# Patient Record
Sex: Female | Born: 1965 | Race: White | Hispanic: No | Marital: Married | Smoking: Light tobacco smoker
Health system: Southern US, Community
[De-identification: ages and names within clinical notes are randomized; demographics above are authoritative.]

## PROBLEM LIST (undated history)

## (undated) DIAGNOSIS — F419 Anxiety disorder, unspecified: Secondary | ICD-10-CM

## (undated) HISTORY — DX: Anxiety disorder, unspecified: F41.9

## (undated) HISTORY — PX: WISDOM TOOTH EXTRACTION: SHX21

## (undated) HISTORY — PX: COLONOSCOPY: SHX174

---

## 1998-11-11 ENCOUNTER — Other Ambulatory Visit: Admission: RE | Admit: 1998-11-11 | Discharge: 1998-11-11 | Payer: Self-pay | Admitting: Obstetrics and Gynecology

## 1999-12-17 ENCOUNTER — Other Ambulatory Visit: Admission: RE | Admit: 1999-12-17 | Discharge: 1999-12-17 | Payer: Self-pay | Admitting: Obstetrics and Gynecology

## 2000-03-15 ENCOUNTER — Other Ambulatory Visit: Admission: RE | Admit: 2000-03-15 | Discharge: 2000-03-15 | Payer: Self-pay | Admitting: Obstetrics and Gynecology

## 2000-10-23 ENCOUNTER — Ambulatory Visit (HOSPITAL_COMMUNITY): Admission: RE | Admit: 2000-10-23 | Discharge: 2000-10-23 | Payer: Self-pay | Admitting: Obstetrics and Gynecology

## 2000-10-23 ENCOUNTER — Encounter: Payer: Self-pay | Admitting: Obstetrics and Gynecology

## 2000-11-13 ENCOUNTER — Other Ambulatory Visit: Admission: RE | Admit: 2000-11-13 | Discharge: 2000-11-13 | Payer: Self-pay | Admitting: Obstetrics and Gynecology

## 2001-09-18 ENCOUNTER — Inpatient Hospital Stay (HOSPITAL_COMMUNITY): Admission: AD | Admit: 2001-09-18 | Discharge: 2001-09-18 | Payer: Self-pay | Admitting: Obstetrics and Gynecology

## 2001-09-19 ENCOUNTER — Inpatient Hospital Stay (HOSPITAL_COMMUNITY): Admission: AD | Admit: 2001-09-19 | Discharge: 2001-09-21 | Payer: Self-pay | Admitting: Obstetrics and Gynecology

## 2002-09-30 ENCOUNTER — Other Ambulatory Visit: Admission: RE | Admit: 2002-09-30 | Discharge: 2002-09-30 | Payer: Self-pay | Admitting: Obstetrics and Gynecology

## 2003-04-19 ENCOUNTER — Inpatient Hospital Stay (HOSPITAL_COMMUNITY): Admission: AD | Admit: 2003-04-19 | Discharge: 2003-04-21 | Payer: Self-pay | Admitting: Obstetrics and Gynecology

## 2003-04-24 ENCOUNTER — Encounter: Admission: RE | Admit: 2003-04-24 | Discharge: 2003-05-24 | Payer: Self-pay | Admitting: Obstetrics and Gynecology

## 2003-05-19 ENCOUNTER — Other Ambulatory Visit: Admission: RE | Admit: 2003-05-19 | Discharge: 2003-05-19 | Payer: Self-pay | Admitting: Obstetrics and Gynecology

## 2004-08-19 ENCOUNTER — Other Ambulatory Visit: Admission: RE | Admit: 2004-08-19 | Discharge: 2004-08-19 | Payer: Self-pay | Admitting: Obstetrics and Gynecology

## 2005-11-07 ENCOUNTER — Other Ambulatory Visit: Admission: RE | Admit: 2005-11-07 | Discharge: 2005-11-07 | Payer: Self-pay | Admitting: Obstetrics and Gynecology

## 2007-04-20 ENCOUNTER — Encounter: Admission: RE | Admit: 2007-04-20 | Discharge: 2007-04-20 | Payer: Self-pay | Admitting: Obstetrics and Gynecology

## 2007-12-26 ENCOUNTER — Ambulatory Visit: Payer: Self-pay | Admitting: Internal Medicine

## 2008-01-07 ENCOUNTER — Ambulatory Visit: Payer: Self-pay | Admitting: Internal Medicine

## 2014-05-23 ENCOUNTER — Other Ambulatory Visit: Payer: Self-pay | Admitting: Obstetrics and Gynecology

## 2014-05-23 DIAGNOSIS — R928 Other abnormal and inconclusive findings on diagnostic imaging of breast: Secondary | ICD-10-CM

## 2014-07-01 ENCOUNTER — Ambulatory Visit
Admission: RE | Admit: 2014-07-01 | Discharge: 2014-07-01 | Disposition: A | Discharge status: Home / Self Care | Payer: BC Managed Care – PPO | Source: Ambulatory Visit | Attending: Obstetrics and Gynecology | Admitting: Obstetrics and Gynecology

## 2014-07-01 DIAGNOSIS — R928 Other abnormal and inconclusive findings on diagnostic imaging of breast: Secondary | ICD-10-CM

## 2016-12-01 DIAGNOSIS — Z6822 Body mass index (BMI) 22.0-22.9, adult: Secondary | ICD-10-CM | POA: Diagnosis not present

## 2016-12-01 DIAGNOSIS — Z01419 Encounter for gynecological examination (general) (routine) without abnormal findings: Secondary | ICD-10-CM | POA: Diagnosis not present

## 2016-12-01 DIAGNOSIS — Z1231 Encounter for screening mammogram for malignant neoplasm of breast: Secondary | ICD-10-CM | POA: Diagnosis not present

## 2017-01-09 DIAGNOSIS — Z1321 Encounter for screening for nutritional disorder: Secondary | ICD-10-CM | POA: Diagnosis not present

## 2017-01-09 DIAGNOSIS — Z1329 Encounter for screening for other suspected endocrine disorder: Secondary | ICD-10-CM | POA: Diagnosis not present

## 2017-01-09 DIAGNOSIS — Z131 Encounter for screening for diabetes mellitus: Secondary | ICD-10-CM | POA: Diagnosis not present

## 2017-01-09 DIAGNOSIS — Z13220 Encounter for screening for lipoid disorders: Secondary | ICD-10-CM | POA: Diagnosis not present

## 2017-08-21 DIAGNOSIS — F411 Generalized anxiety disorder: Secondary | ICD-10-CM | POA: Diagnosis not present

## 2017-09-05 DIAGNOSIS — F411 Generalized anxiety disorder: Secondary | ICD-10-CM | POA: Diagnosis not present

## 2017-09-11 DIAGNOSIS — F411 Generalized anxiety disorder: Secondary | ICD-10-CM | POA: Diagnosis not present

## 2017-10-11 DIAGNOSIS — F411 Generalized anxiety disorder: Secondary | ICD-10-CM | POA: Diagnosis not present

## 2017-11-23 DIAGNOSIS — F411 Generalized anxiety disorder: Secondary | ICD-10-CM | POA: Diagnosis not present

## 2017-12-11 ENCOUNTER — Encounter: Payer: Self-pay | Admitting: Gastroenterology

## 2017-12-21 DIAGNOSIS — F411 Generalized anxiety disorder: Secondary | ICD-10-CM | POA: Diagnosis not present

## 2018-01-24 DIAGNOSIS — Z1231 Encounter for screening mammogram for malignant neoplasm of breast: Secondary | ICD-10-CM | POA: Diagnosis not present

## 2018-01-24 DIAGNOSIS — Z6822 Body mass index (BMI) 22.0-22.9, adult: Secondary | ICD-10-CM | POA: Diagnosis not present

## 2018-01-24 DIAGNOSIS — N926 Irregular menstruation, unspecified: Secondary | ICD-10-CM | POA: Diagnosis not present

## 2018-01-24 DIAGNOSIS — Z01419 Encounter for gynecological examination (general) (routine) without abnormal findings: Secondary | ICD-10-CM | POA: Diagnosis not present

## 2018-02-27 DIAGNOSIS — M5431 Sciatica, right side: Secondary | ICD-10-CM | POA: Diagnosis not present

## 2018-02-27 DIAGNOSIS — E7800 Pure hypercholesterolemia, unspecified: Secondary | ICD-10-CM | POA: Diagnosis not present

## 2018-02-27 DIAGNOSIS — R200 Anesthesia of skin: Secondary | ICD-10-CM | POA: Diagnosis not present

## 2018-02-27 DIAGNOSIS — F418 Other specified anxiety disorders: Secondary | ICD-10-CM | POA: Diagnosis not present

## 2018-03-02 DIAGNOSIS — R87810 Cervical high risk human papillomavirus (HPV) DNA test positive: Secondary | ICD-10-CM | POA: Diagnosis not present

## 2018-03-02 DIAGNOSIS — R87610 Atypical squamous cells of undetermined significance on cytologic smear of cervix (ASC-US): Secondary | ICD-10-CM | POA: Diagnosis not present

## 2018-03-02 DIAGNOSIS — Z3202 Encounter for pregnancy test, result negative: Secondary | ICD-10-CM | POA: Diagnosis not present

## 2018-03-02 DIAGNOSIS — N870 Mild cervical dysplasia: Secondary | ICD-10-CM | POA: Diagnosis not present

## 2018-03-29 ENCOUNTER — Encounter: Payer: Self-pay | Admitting: Gastroenterology

## 2018-05-14 ENCOUNTER — Encounter: Payer: Self-pay | Admitting: Gastroenterology

## 2018-05-14 ENCOUNTER — Ambulatory Visit (AMBULATORY_SURGERY_CENTER): Payer: BLUE CROSS/BLUE SHIELD

## 2018-05-14 VITALS — Ht 65.0 in | Wt 132.0 lb

## 2018-05-14 DIAGNOSIS — Z8371 Family history of colonic polyps: Secondary | ICD-10-CM

## 2018-05-14 MED ORDER — NA SULFATE-K SULFATE-MG SULF 17.5-3.13-1.6 GM/177ML PO SOLN: 1.0000 | Freq: Once | ORAL | 0 refills | Status: AC

## 2018-05-14 NOTE — Progress Notes (Signed)
Per pt, no allergies to soy or egg products.Pt not taking any weight loss meds or using  O2 at home.  Pt refused emmi video. 

## 2018-05-22 ENCOUNTER — Encounter: Payer: Self-pay | Admitting: Gastroenterology

## 2018-05-22 ENCOUNTER — Ambulatory Visit (AMBULATORY_SURGERY_CENTER): Payer: BLUE CROSS/BLUE SHIELD | Admitting: Gastroenterology

## 2018-05-22 VITALS — BP 134/87 | HR 64 | Temp 98.0°F | Resp 20 | Ht 65.0 in | Wt 132.0 lb

## 2018-05-22 DIAGNOSIS — Z1211 Encounter for screening for malignant neoplasm of colon: Secondary | ICD-10-CM

## 2018-05-22 DIAGNOSIS — Z8371 Family history of colonic polyps: Secondary | ICD-10-CM | POA: Diagnosis not present

## 2018-05-22 MED ORDER — SODIUM CHLORIDE 0.9 % IV SOLN: 500.0000 mL | Freq: Once | INTRAVENOUS | Status: DC

## 2018-05-22 NOTE — Patient Instructions (Signed)
YOU HAD AN ENDOSCOPIC PROCEDURE TODAY AT THE [facility]:   Refer to the procedure report that was given to you for any specific questions about what was found during the examination.  If the procedure report does not answer your questions, please call your gastroenterologist to clarify.  If you requested that your care partner not be given the details of your procedure findings, then the procedure report has been included in a sealed envelope for you to review at your convenience later.  YOU SHOULD EXPECT: Some feelings of bloating in the abdomen. Passage of more gas than usual.  Walking can help get rid of the air that was put into your GI tract during the procedure and reduce the bloating. If you had a lower endoscopy (such as a colonoscopy or flexible sigmoidoscopy) you may notice spotting of blood in your stool or on the toilet paper. If you underwent a bowel prep for your procedure, you may not have a normal bowel movement for a few days.  Please Note:  You might notice some irritation and congestion in your nose or some drainage.  This is from the oxygen used during your procedure.  There is no need for concern and it should clear up in a day or so.  SYMPTOMS TO REPORT IMMEDIATELY:   Following lower endoscopy (colonoscopy or flexible sigmoidoscopy):  Excessive amounts of blood in the stool  Significant tenderness or worsening of abdominal pains  Swelling of the abdomen that is new, acute  Fever of 100F or higher   For urgent or emergent issues, a gastroenterologist can be reached at any hour by calling [phone].   DIET:  We do recommend a small meal at first, but then you may proceed to your regular diet.  Drink plenty of fluids but you should avoid alcoholic beverages for 24 hours.Try to increase the fiber in your diet, and drink plenty of fluids.  ACTIVITY:  You should plan to take it easy for the rest of today and you should NOT DRIVE or use heavy machinery until  tomorrow (because of the sedation medicines used during the test).    FOLLOW UP: Our staff will call the number listed on your records the next business day following your procedure to check on you and address any questions or concerns that you may have regarding the information given to you following your procedure. If we do not reach you, we will leave a message.  However, if you are feeling well and you are not experiencing any problems, there is no need to return our call.  We will assume that you have returned to your regular daily activities without incident.  If any biopsies were taken you will be contacted by phone or by letter within the next 1-3 weeks.  Please call us at [phone] if you have not heard about the biopsies in 3 weeks.    SIGNATURES/CONFIDENTIALITY: You and/or your care partner have signed paperwork which will be entered into your electronic medical record.  These signatures attest to the fact that that the information above on your After Visit Summary has been reviewed and is understood.  Full responsibility of the confidentiality of this discharge information lies with you and/or your care-partner.  Read all of the handouts given to you by your recovery room nurse.

## 2018-05-22 NOTE — Op Note (Signed)
[facility] Patient Name: Janet Lawrence Procedure Date: 05/22/2018 8:58 AM MRN: [id number] Endoscopist: Napoleon Form , MD Age: 52 Referring MD:  Date of Birth: Sep 28, 1965 Gender: Female Account #: [id number] Procedure:                Colonoscopy Indications:              Screening for colorectal malignant neoplasm Medicines:                Monitored Anesthesia Care Procedure:                Pre-Anesthesia Assessment:                           - Prior to the procedure, a History and Physical                            was performed, and patient medications and                            allergies were reviewed. The patient's tolerance of                            previous anesthesia was also reviewed. The risks                            and benefits of the procedure and the sedation                            options and risks were discussed with the patient.                            All questions were answered, and informed consent                            was obtained. Prior Anticoagulants: The patient has                            taken no previous anticoagulant or antiplatelet                            agents. ASA Grade Assessment: II - A patient with                            mild systemic disease. After reviewing the risks                            and benefits, the patient was deemed in                            satisfactory condition to undergo the procedure.                           After obtaining informed consent, the colonoscope  was passed under direct vision. Throughout the                            procedure, the patient's blood pressure, pulse, and                            oxygen saturations were monitored continuously. The                            Colonoscope was introduced through the anus and                            advanced to the the cecum, identified by                            appendiceal orifice  and ileocecal valve. The                            colonoscopy was performed without difficulty. The                            patient tolerated the procedure well. The quality                            of the bowel preparation was excellent. The                            ileocecal valve, appendiceal orifice, and rectum                            were photographed. Scope In: 9:07:05 AM Scope Out: 9:23:37 AM Scope Withdrawal Time: 0 hours 9 minutes 9 seconds  Total Procedure Duration: 0 hours 16 minutes 32 seconds  Findings:                 The perianal and digital rectal examinations were                            normal.                           Non-bleeding internal hemorrhoids were found during                            retroflexion. The hemorrhoids were medium-sized.                           The exam was otherwise without abnormality. Complications:            No immediate complications. Estimated Blood Loss:     Estimated blood loss: none. Impression:               - Non-bleeding internal hemorrhoids.                           - The examination was otherwise normal.                           -  No specimens collected. Recommendation:           - Patient has a contact number available for                            emergencies. The signs and symptoms of potential                            delayed complications were discussed with the                            patient. Return to normal activities tomorrow.                            Written discharge instructions were provided to the                            patient.                           - Resume previous diet.                           - Continue present medications.                           - Repeat colonoscopy in 10 years for screening                            purposes. Napoleon Form, MD 05/22/2018 9:28:25 AM This report has been signed electronically.

## 2018-05-22 NOTE — Progress Notes (Signed)
Pt's states no medical or surgical changes since previsit or office visit. 

## 2018-05-22 NOTE — Progress Notes (Signed)
To PACU, vSS. Report to [email or web address]

## 2018-05-23 ENCOUNTER — Telehealth: Payer: Self-pay | Admitting: *Deleted

## 2018-05-23 NOTE — Telephone Encounter (Signed)
  Follow up Call-  Call back number 05/22/2018  Post procedure Call Back phone  # [phone]  Permission to leave phone message Yes  Some recent data might be hidden     Patient questions:  Do you have a fever, pain , or abdominal swelling? No. Pain Score  0 *  Have you tolerated food without any problems? Yes.    Have you been able to return to your normal activities? Yes.    Do you have any questions about your discharge instructions: Diet   No. Medications  No. Follow up visit  No.  Do you have questions or concerns about your Care? No.  Actions: * If pain score is 4 or above: No action needed, pain <4.

## 2020-03-30 ENCOUNTER — Other Ambulatory Visit: Payer: Self-pay | Admitting: Family Medicine

## 2020-03-30 DIAGNOSIS — M25552 Pain in left hip: Secondary | ICD-10-CM

## 2020-04-02 ENCOUNTER — Ambulatory Visit
Admission: RE | Admit: 2020-04-02 | Discharge: 2020-04-02 | Disposition: A | Discharge status: Home / Self Care | Payer: 59 | Source: Ambulatory Visit | Attending: Family Medicine | Admitting: Family Medicine

## 2020-04-02 DIAGNOSIS — M25552 Pain in left hip: Secondary | ICD-10-CM

## 2020-07-07 DIAGNOSIS — F4323 Adjustment disorder with mixed anxiety and depressed mood: Secondary | ICD-10-CM | POA: Diagnosis not present

## 2020-07-08 DIAGNOSIS — F4323 Adjustment disorder with mixed anxiety and depressed mood: Secondary | ICD-10-CM | POA: Diagnosis not present

## 2020-07-16 DIAGNOSIS — F4323 Adjustment disorder with mixed anxiety and depressed mood: Secondary | ICD-10-CM | POA: Diagnosis not present

## 2020-07-28 DIAGNOSIS — F4323 Adjustment disorder with mixed anxiety and depressed mood: Secondary | ICD-10-CM | POA: Diagnosis not present

## 2020-08-10 DIAGNOSIS — F4323 Adjustment disorder with mixed anxiety and depressed mood: Secondary | ICD-10-CM | POA: Diagnosis not present

## 2020-09-10 DIAGNOSIS — F4323 Adjustment disorder with mixed anxiety and depressed mood: Secondary | ICD-10-CM | POA: Diagnosis not present

## 2020-10-05 DIAGNOSIS — F4323 Adjustment disorder with mixed anxiety and depressed mood: Secondary | ICD-10-CM | POA: Diagnosis not present

## 2020-10-14 DIAGNOSIS — F4323 Adjustment disorder with mixed anxiety and depressed mood: Secondary | ICD-10-CM | POA: Diagnosis not present

## 2020-10-16 DIAGNOSIS — F4323 Adjustment disorder with mixed anxiety and depressed mood: Secondary | ICD-10-CM | POA: Diagnosis not present

## 2020-10-21 DIAGNOSIS — F4323 Adjustment disorder with mixed anxiety and depressed mood: Secondary | ICD-10-CM | POA: Diagnosis not present

## 2020-10-28 DIAGNOSIS — F4323 Adjustment disorder with mixed anxiety and depressed mood: Secondary | ICD-10-CM | POA: Diagnosis not present

## 2020-11-04 DIAGNOSIS — F4323 Adjustment disorder with mixed anxiety and depressed mood: Secondary | ICD-10-CM | POA: Diagnosis not present

## 2020-11-12 DIAGNOSIS — F4323 Adjustment disorder with mixed anxiety and depressed mood: Secondary | ICD-10-CM | POA: Diagnosis not present

## 2020-11-30 DIAGNOSIS — F4323 Adjustment disorder with mixed anxiety and depressed mood: Secondary | ICD-10-CM | POA: Diagnosis not present

## 2020-12-16 DIAGNOSIS — F4323 Adjustment disorder with mixed anxiety and depressed mood: Secondary | ICD-10-CM | POA: Diagnosis not present

## 2020-12-22 DIAGNOSIS — F4323 Adjustment disorder with mixed anxiety and depressed mood: Secondary | ICD-10-CM | POA: Diagnosis not present

## 2021-02-05 DIAGNOSIS — B353 Tinea pedis: Secondary | ICD-10-CM | POA: Diagnosis not present

## 2021-02-05 DIAGNOSIS — D225 Melanocytic nevi of trunk: Secondary | ICD-10-CM | POA: Diagnosis not present

## 2021-02-05 DIAGNOSIS — L578 Other skin changes due to chronic exposure to nonionizing radiation: Secondary | ICD-10-CM | POA: Diagnosis not present

## 2021-03-01 ENCOUNTER — Other Ambulatory Visit: Payer: Self-pay | Admitting: Radiology

## 2021-03-01 DIAGNOSIS — N63 Unspecified lump in unspecified breast: Secondary | ICD-10-CM

## 2021-03-01 DIAGNOSIS — N6310 Unspecified lump in the right breast, unspecified quadrant: Secondary | ICD-10-CM | POA: Diagnosis not present

## 2021-03-08 ENCOUNTER — Other Ambulatory Visit: Payer: Self-pay

## 2021-03-08 ENCOUNTER — Ambulatory Visit
Admission: RE | Admit: 2021-03-08 | Discharge: 2021-03-08 | Disposition: A | Discharge status: Home / Self Care | Payer: BC Managed Care – PPO | Source: Ambulatory Visit | Attending: Radiology | Admitting: Radiology

## 2021-03-08 ENCOUNTER — Ambulatory Visit
Admission: RE | Admit: 2021-03-08 | Discharge: 2021-03-08 | Disposition: A | Discharge status: Home / Self Care | Payer: BLUE CROSS/BLUE SHIELD | Source: Ambulatory Visit | Attending: Radiology | Admitting: Radiology

## 2021-03-08 DIAGNOSIS — R922 Inconclusive mammogram: Secondary | ICD-10-CM | POA: Diagnosis not present

## 2021-03-08 DIAGNOSIS — N63 Unspecified lump in unspecified breast: Secondary | ICD-10-CM

## 2021-03-08 DIAGNOSIS — N6311 Unspecified lump in the right breast, upper outer quadrant: Secondary | ICD-10-CM | POA: Diagnosis not present

## 2021-03-09 DIAGNOSIS — Z1329 Encounter for screening for other suspected endocrine disorder: Secondary | ICD-10-CM | POA: Diagnosis not present

## 2021-03-09 DIAGNOSIS — B373 Candidiasis of vulva and vagina: Secondary | ICD-10-CM | POA: Diagnosis not present

## 2021-03-09 DIAGNOSIS — Z131 Encounter for screening for diabetes mellitus: Secondary | ICD-10-CM | POA: Diagnosis not present

## 2021-03-09 DIAGNOSIS — Z01419 Encounter for gynecological examination (general) (routine) without abnormal findings: Secondary | ICD-10-CM | POA: Diagnosis not present

## 2021-03-09 DIAGNOSIS — R319 Hematuria, unspecified: Secondary | ICD-10-CM | POA: Diagnosis not present

## 2021-03-09 DIAGNOSIS — Z6821 Body mass index (BMI) 21.0-21.9, adult: Secondary | ICD-10-CM | POA: Diagnosis not present

## 2021-03-09 DIAGNOSIS — N760 Acute vaginitis: Secondary | ICD-10-CM | POA: Diagnosis not present

## 2021-03-09 DIAGNOSIS — N951 Menopausal and female climacteric states: Secondary | ICD-10-CM | POA: Diagnosis not present

## 2021-03-09 DIAGNOSIS — Z13220 Encounter for screening for lipoid disorders: Secondary | ICD-10-CM | POA: Diagnosis not present

## 2021-03-15 ENCOUNTER — Other Ambulatory Visit: Payer: BLUE CROSS/BLUE SHIELD

## 2021-04-19 DIAGNOSIS — F418 Other specified anxiety disorders: Secondary | ICD-10-CM | POA: Diagnosis not present

## 2021-04-19 DIAGNOSIS — Z0000 Encounter for general adult medical examination without abnormal findings: Secondary | ICD-10-CM | POA: Diagnosis not present

## 2021-04-19 DIAGNOSIS — R300 Dysuria: Secondary | ICD-10-CM | POA: Diagnosis not present

## 2021-04-19 DIAGNOSIS — E7800 Pure hypercholesterolemia, unspecified: Secondary | ICD-10-CM | POA: Diagnosis not present

## 2021-06-15 ENCOUNTER — Encounter (HOSPITAL_COMMUNITY): Payer: Self-pay | Admitting: Emergency Medicine

## 2021-06-15 ENCOUNTER — Emergency Department (HOSPITAL_COMMUNITY)
Admission: EM | Admit: 2021-06-15 | Discharge: 2021-06-16 | Disposition: A | Discharge status: Home / Self Care | Payer: BC Managed Care – PPO | Attending: Student | Admitting: Student

## 2021-06-15 ENCOUNTER — Other Ambulatory Visit: Payer: Self-pay

## 2021-06-15 DIAGNOSIS — F17200 Nicotine dependence, unspecified, uncomplicated: Secondary | ICD-10-CM | POA: Diagnosis not present

## 2021-06-15 DIAGNOSIS — J029 Acute pharyngitis, unspecified: Secondary | ICD-10-CM | POA: Insufficient documentation

## 2021-06-15 MED ORDER — BENZOCAINE 20 % MT AERO
INHALATION_SPRAY | Freq: Once | OROMUCOSAL | Status: DC
  Filled 2021-06-15: qty 57

## 2021-06-15 NOTE — ED Triage Notes (Signed)
Patient reports a piece of pasta lodged at lower throat while eating this evening , airway intact , respirations unlabored , curenttly taking oral antibiotic for sore throat .

## 2021-06-15 NOTE — ED Provider Notes (Signed)
Emergency Medicine Provider Triage Evaluation Note  Janet Lawrence , a 55 y.o. female  was evaluated in triage.  Pt complains of sore throat concerns of a mucosal catheter.  Patient states that she was eating Posta and felt a, throat pain started over the last 3 hours, she has been unable to dislodge it, she states she still able to tolerate p.o., no nausea or vomiting.  she also notes that she is having a sore throat since yesterday, states that she had a virtual visit with the MD who started her on amoxicillin for possible strep.  And then sent her here today concerned that she might have a foreign body stuck in her throat.  Review of Systems  Positive: Sore throat, foreign body in throat. Negative: Shortness of breath, difficulty swallowing  Physical Exam  BP (!) 141/98 (BP Location: Right Arm)   Pulse 73   Temp 99.2 F (37.3 C) (Oral)   Resp 16   Ht 5\' 4"  (1.626 m)   Wt 65 kg   SpO2 100%   BMI 24.60 kg/m  Gen:   Awake, no distress   Resp:  Normal effort normal respirations, no wheezing, stridor, no respiratory distress present. MSK:   Moves extremities without difficulty  Other:  /Visualized tongue uvula are both midline, controlling oral secretions, tonsils are equal and symmetrical bilaterally no erythema or exudates present, no foreign object noted.  Medical Decision Making  Medically screening exam initiated at 11:38 PM.  Appropriate orders placed.  Velena Keegan was informed that the remainder of the evaluation will be completed by another provider, this initial triage assessment does not replace that evaluation, and the importance of remaining in the ED until their evaluation is complete.  Presents with foreign body in throat/sore throat has been ordered, patient need further work-up.   Ardis Rowan, PA-C 06/15/21 2341    13/08/22, MD 06/16/21 [phone]

## 2021-06-16 LAB — GROUP A STREP BY PCR: Group A Strep by PCR: NOT DETECTED

## 2021-06-16 NOTE — ED Provider Notes (Signed)
[facility] EMERGENCY DEPARTMENT Provider Note   CSN: [id number] Arrival date & time: 06/15/21  2311     History Chief Complaint  Patient presents with   Pasta Lodged in Throat/Sore Throat    Janet Lawrence is a 55 y.o. female.  HPI  Patient with no significant medical history presents to the emergency department with chief complaint of a foreign body in her throat.  Patient states that while she was eating bowtie pasta today she felt as if something was stuck in her throat, she states that she has been unable to get it out.  She tried drinking water and coughing up but was unsuccessful.  She states she still able to tolerate p.o. without any difficulty, she denies  shortness of breath, difficulty breathing, she denies coughing.  She does note that she has been having a sore throat x1 day, her sore throat started yesterday, states the pain has gotten worse today, she had video visit and was started  on amoxicillin for possible strep throat and told her to come the ED for further evaluation of the foreign body in her throat.  She has no other complaints at this time.  She has no history of esophageal strictures, no history of foreign body stuck in her throat, she does not endorse fevers, chills, chest pain, shortness of breath, worsening peripheral edema.  Past Medical History:  Diagnosis Date   Anxiety     There are no problems to display for this patient.   Past Surgical History:  Procedure Laterality Date   COLONOSCOPY     WISDOM TOOTH EXTRACTION       OB History   No obstetric history on file.     Family History  Problem Relation Age of Onset   Thyroid disease Mother    Non-Hodgkin's lymphoma Father    Alzheimer's disease Father    Ankylosing spondylitis Father    Colon polyps Brother    Ankylosing spondylitis Brother     Social History   Tobacco Use   Smoking status: Light Smoker   Smokeless tobacco: Never   Tobacco comments:    socially   Substance Use Topics   Alcohol use: Yes    Alcohol/week: 5.0 - 7.0 standard drinks    Types: 5 - 7 Glasses of wine per week   Drug use: Not Currently    Home Medications Prior to Admission medications   Medication Sig Start Date End Date Taking? Authorizing Provider  Biotin 1 MG CAPS Take by mouth daily.    [provider]  Multiple Vitamin (MULTIVITAMIN) tablet Take 1 tablet by mouth daily.    [provider]  propranolol (INDERAL) 10 MG tablet Take 10 mg by mouth as needed.    [provider]  Riboflavin (B2 PO) Take by mouth daily.    [provider]    Allergies    Patient has no known allergies.  Review of Systems   Review of Systems  Constitutional:  Negative for chills and fever.  HENT:  Positive for sore throat. Negative for congestion.        Foreign body in throat.  Respiratory:  Negative for shortness of breath.   Cardiovascular:  Negative for chest pain.  Gastrointestinal:  Negative for abdominal pain.  Genitourinary:  Negative for enuresis.  Musculoskeletal:  Negative for back pain.  Skin:  Negative for rash.  Neurological:  Negative for dizziness.  Hematological:  Does not bruise/bleed easily.   Physical Exam Updated Vital  Signs BP (!) 141/98 (BP Location: Right Arm)   Pulse 73   Temp 99.2 F (37.3 C) (Oral)   Resp 16   Ht 5\' 4"  (1.626 m)   Wt 65 kg   SpO2 100%   BMI 24.60 kg/m   Physical Exam Vitals and nursing note reviewed.  Constitutional:      General: She is not in acute distress.    Appearance: She is not ill-appearing.  HENT:     Head: Normocephalic and atraumatic.     Nose: No congestion.     Mouth/Throat:     Mouth: Mucous membranes are moist.     Pharynx: Oropharynx is clear. No oropharyngeal exudate or posterior oropharyngeal erythema.     Comments: No trismus or torticollis present, oropharynx is visualized tongue and uvula are both midline, controlling oral secretions, tonsils were equal and  symmetrical bilaterally, no erythema or exudate present. Eyes:     Conjunctiva/sclera: Conjunctivae normal.  Cardiovascular:     Rate and Rhythm: Normal rate and regular rhythm.     Pulses: Normal pulses.     Heart sounds: No murmur heard.   No friction rub. No gallop.  Pulmonary:     Effort: No respiratory distress.     Breath sounds: No stridor. No wheezing, rhonchi or rales.  Skin:    General: Skin is warm and dry.  Neurological:     Mental Status: She is alert.  Psychiatric:        Mood and Affect: Mood normal.    ED Results / Procedures / Treatments   Labs (all labs ordered are listed, but only abnormal results are displayed) Labs Reviewed  GROUP A STREP BY PCR    EKG None  Radiology No results found.  Procedures Procedures   Medications Ordered in ED Medications  Benzocaine (HURRCAINE) 20 % mouth spray (has no administration in time range)    ED Course  I have reviewed the triage vital signs and the nursing notes.  Pertinent labs & imaging results that were available during my care of the patient were reviewed by me and considered in my medical decision making (see chart for details).    MDM Rules/Calculators/A&P                          Initial impression-presents with sore throat foreign body sensation.  She is alert, does not appear in distress, vital signs reassuring.  Will recommend flexible laryngeal scope for further evaluation.  Work-up-strep test is pending at this time.  Reassessment-while using a flexible laryngeal scope Dr. examined patient's oropharynx, there is no foreign bodies or other gross abnormalities present.  Patient tolerated the procedure well.  Rule out-I have low suspicion for foreign body in the esophagus/oropharynx as she tolerating p.o. without difficulty, laryngoscope was negative for any acute findings.  I have low suspicion for foreign body in the upper airway has lung sounds were clear bilaterally, no wheezing or  stridor present.  I have low suspicion for peritonsillar or retropharyngeal abscess as tonsils were equal and symmetrical bilaterally uvula was midline she is controlling oral secretions.  Plan-  Sore throat/foreign body-likely patient setting from pharyngitis, she is currently on amoxicillin  will have her continue with this as it would cover for strep throat.  If she continues to have foreign body sensation will have her follow-up with ENT for further evaluation.  Gave her strict return precautions.  Vital signs have remained stable, no  indication for hospital admission.  Patient discussed with attending and they agreed with assessment and plan.  Patient given at home care as well strict return precautions.  Patient verbalized that they understood agreed to said plan.  Final Clinical Impression(s) / ED Diagnoses Final diagnoses:  Sore throat    Rx / DC Orders ED Discharge Orders     None        Carroll Sage, PA-C 06/16/21 0139    Gloris Manchester, MD 06/16/21 [phone]

## 2021-06-16 NOTE — Discharge Instructions (Addendum)
Exam was all reassuring, your strep test is pending at this time.  I want to continue with the amoxicillin that was prescribed to you as this would cover you for strep throat.   I want you to follow-up with ENT if symptoms do not resolve or are  not improving after 2 to 3 days, give you the contact information above.  Come back to the emergency department if you develop chest pain, shortness of breath, severe abdominal pain, uncontrolled nausea, vomiting, diarrhea.

## 2021-06-25 DIAGNOSIS — L658 Other specified nonscarring hair loss: Secondary | ICD-10-CM | POA: Diagnosis not present

## 2021-06-25 DIAGNOSIS — D485 Neoplasm of uncertain behavior of skin: Secondary | ICD-10-CM | POA: Diagnosis not present

## 2021-06-25 DIAGNOSIS — L820 Inflamed seborrheic keratosis: Secondary | ICD-10-CM | POA: Diagnosis not present

## 2022-02-05 DIAGNOSIS — R051 Acute cough: Secondary | ICD-10-CM | POA: Diagnosis not present

## 2022-02-15 DIAGNOSIS — L578 Other skin changes due to chronic exposure to nonionizing radiation: Secondary | ICD-10-CM | POA: Diagnosis not present

## 2022-02-15 DIAGNOSIS — D225 Melanocytic nevi of trunk: Secondary | ICD-10-CM | POA: Diagnosis not present

## 2022-02-15 DIAGNOSIS — B353 Tinea pedis: Secondary | ICD-10-CM | POA: Diagnosis not present

## 2022-03-24 DIAGNOSIS — Z6822 Body mass index (BMI) 22.0-22.9, adult: Secondary | ICD-10-CM | POA: Diagnosis not present

## 2022-03-24 DIAGNOSIS — Z1231 Encounter for screening mammogram for malignant neoplasm of breast: Secondary | ICD-10-CM | POA: Diagnosis not present

## 2022-03-24 DIAGNOSIS — Z131 Encounter for screening for diabetes mellitus: Secondary | ICD-10-CM | POA: Diagnosis not present

## 2022-03-24 DIAGNOSIS — Z13228 Encounter for screening for other metabolic disorders: Secondary | ICD-10-CM | POA: Diagnosis not present

## 2022-03-24 DIAGNOSIS — Z1321 Encounter for screening for nutritional disorder: Secondary | ICD-10-CM | POA: Diagnosis not present

## 2022-03-24 DIAGNOSIS — Z1329 Encounter for screening for other suspected endocrine disorder: Secondary | ICD-10-CM | POA: Diagnosis not present

## 2022-03-24 DIAGNOSIS — Z1151 Encounter for screening for human papillomavirus (HPV): Secondary | ICD-10-CM | POA: Diagnosis not present

## 2022-03-24 DIAGNOSIS — Z01419 Encounter for gynecological examination (general) (routine) without abnormal findings: Secondary | ICD-10-CM | POA: Diagnosis not present

## 2022-03-24 DIAGNOSIS — Z13220 Encounter for screening for lipoid disorders: Secondary | ICD-10-CM | POA: Diagnosis not present

## 2022-03-24 DIAGNOSIS — Z124 Encounter for screening for malignant neoplasm of cervix: Secondary | ICD-10-CM | POA: Diagnosis not present

## 2022-05-06 DIAGNOSIS — M25572 Pain in left ankle and joints of left foot: Secondary | ICD-10-CM | POA: Diagnosis not present

## 2022-06-06 DIAGNOSIS — M25572 Pain in left ankle and joints of left foot: Secondary | ICD-10-CM | POA: Diagnosis not present

## 2022-06-10 DIAGNOSIS — M25572 Pain in left ankle and joints of left foot: Secondary | ICD-10-CM | POA: Diagnosis not present

## 2022-06-15 DIAGNOSIS — Z0000 Encounter for general adult medical examination without abnormal findings: Secondary | ICD-10-CM | POA: Diagnosis not present

## 2022-06-15 DIAGNOSIS — E7800 Pure hypercholesterolemia, unspecified: Secondary | ICD-10-CM | POA: Diagnosis not present

## 2022-06-15 DIAGNOSIS — R400 Somnolence: Secondary | ICD-10-CM | POA: Diagnosis not present

## 2022-06-15 DIAGNOSIS — F418 Other specified anxiety disorders: Secondary | ICD-10-CM | POA: Diagnosis not present

## 2022-06-15 DIAGNOSIS — R61 Generalized hyperhidrosis: Secondary | ICD-10-CM | POA: Diagnosis not present

## 2022-06-20 DIAGNOSIS — M25572 Pain in left ankle and joints of left foot: Secondary | ICD-10-CM | POA: Diagnosis not present

## 2022-07-06 DIAGNOSIS — G4719 Other hypersomnia: Secondary | ICD-10-CM | POA: Diagnosis not present

## 2022-12-02 DIAGNOSIS — Q6589 Other specified congenital deformities of hip: Secondary | ICD-10-CM | POA: Diagnosis not present

## 2022-12-13 DIAGNOSIS — M6281 Muscle weakness (generalized): Secondary | ICD-10-CM | POA: Diagnosis not present

## 2022-12-13 DIAGNOSIS — M1632 Unilateral osteoarthritis resulting from hip dysplasia, left hip: Secondary | ICD-10-CM | POA: Diagnosis not present

## 2022-12-22 DIAGNOSIS — M6281 Muscle weakness (generalized): Secondary | ICD-10-CM | POA: Diagnosis not present

## 2022-12-22 DIAGNOSIS — M1632 Unilateral osteoarthritis resulting from hip dysplasia, left hip: Secondary | ICD-10-CM | POA: Diagnosis not present

## 2022-12-29 DIAGNOSIS — M1632 Unilateral osteoarthritis resulting from hip dysplasia, left hip: Secondary | ICD-10-CM | POA: Diagnosis not present

## 2022-12-29 DIAGNOSIS — M6281 Muscle weakness (generalized): Secondary | ICD-10-CM | POA: Diagnosis not present

## 2023-01-04 DIAGNOSIS — M25552 Pain in left hip: Secondary | ICD-10-CM | POA: Diagnosis not present

## 2023-01-25 DIAGNOSIS — M25562 Pain in left knee: Secondary | ICD-10-CM | POA: Diagnosis not present

## 2023-02-01 DIAGNOSIS — S73192A Other sprain of left hip, initial encounter: Secondary | ICD-10-CM | POA: Diagnosis not present

## 2023-02-21 DIAGNOSIS — L658 Other specified nonscarring hair loss: Secondary | ICD-10-CM | POA: Diagnosis not present

## 2023-02-21 DIAGNOSIS — B351 Tinea unguium: Secondary | ICD-10-CM | POA: Diagnosis not present

## 2023-02-21 DIAGNOSIS — D225 Melanocytic nevi of trunk: Secondary | ICD-10-CM | POA: Diagnosis not present

## 2023-02-21 DIAGNOSIS — L578 Other skin changes due to chronic exposure to nonionizing radiation: Secondary | ICD-10-CM | POA: Diagnosis not present

## 2023-04-04 ENCOUNTER — Other Ambulatory Visit (INDEPENDENT_AMBULATORY_CARE_PROVIDER_SITE_OTHER): Payer: BC Managed Care – PPO

## 2023-04-04 ENCOUNTER — Ambulatory Visit (INDEPENDENT_AMBULATORY_CARE_PROVIDER_SITE_OTHER): Payer: BC Managed Care – PPO | Admitting: Orthopaedic Surgery

## 2023-04-04 DIAGNOSIS — M1612 Unilateral primary osteoarthritis, left hip: Secondary | ICD-10-CM | POA: Diagnosis not present

## 2023-04-04 DIAGNOSIS — M25552 Pain in left hip: Secondary | ICD-10-CM

## 2023-04-04 NOTE — Progress Notes (Signed)
The patient is an incredibly active and active 57 year old female who comes in for an opinion as a relates to her left hip.  She is an avid runner and continues to be passion about running.  She is a petite individual as well.  She has been dealing with left hip pain for some time now and did have a MRI arthrogram of her left hip in June.  They did place a steroid injection in her left hip at that time and that this did help some.  She has been dealing with groin pain.  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  There was mild arthritic findings to moderate arthritic findings of that left hip.  Her right hip has not had any issues at all.  She continues to workout but has held off her usual running.  She would still like to run.  She walks with a normal gait.  Her left hip shows some pain in the groin on the extremes of rotation but there is no block to rotation.  She is very limber.  She has good muscle quality as well.  We did obtain a standing AP pelvis and lateral of her left hip today.  We looked at those x-rays together and she does have superior lateral joint space narrowing on the left side compared to the right side and there is just slight more uncoverage of the femoral head.  We did look at the MRI and there is a labral tear as well and some subchondral edema in the hip.  We had a long thorough discussion about her hip.  At some point she may end up considering hip replacement surgery but right now she is not hurting to the degree that with later to having any type of surgical intervention now.  I do feel that she can run on occasion but she should really listen to her body.  She should more so stick to low impact cardiac workouts such as biking or elliptical and swimming or even recumbent bike or set up bike.  She can always consider an intra-articular steroid injection again but I would wait about the first of the year.  This information was helpful to her.  If things  worsen at all she knows to let us know.  All questions and concerns were addressed and answered.

## 2023-04-18 DIAGNOSIS — Z124 Encounter for screening for malignant neoplasm of cervix: Secondary | ICD-10-CM | POA: Diagnosis not present

## 2023-04-18 DIAGNOSIS — Z1321 Encounter for screening for nutritional disorder: Secondary | ICD-10-CM | POA: Diagnosis not present

## 2023-04-18 DIAGNOSIS — Z1231 Encounter for screening mammogram for malignant neoplasm of breast: Secondary | ICD-10-CM | POA: Diagnosis not present

## 2023-04-18 DIAGNOSIS — Z13228 Encounter for screening for other metabolic disorders: Secondary | ICD-10-CM | POA: Diagnosis not present

## 2023-04-18 DIAGNOSIS — Z6824 Body mass index (BMI) 24.0-24.9, adult: Secondary | ICD-10-CM | POA: Diagnosis not present

## 2023-04-18 DIAGNOSIS — Z01419 Encounter for gynecological examination (general) (routine) without abnormal findings: Secondary | ICD-10-CM | POA: Diagnosis not present

## 2023-04-18 DIAGNOSIS — Z1151 Encounter for screening for human papillomavirus (HPV): Secondary | ICD-10-CM | POA: Diagnosis not present

## 2023-04-18 DIAGNOSIS — Z131 Encounter for screening for diabetes mellitus: Secondary | ICD-10-CM | POA: Diagnosis not present

## 2023-04-18 DIAGNOSIS — Z13220 Encounter for screening for lipoid disorders: Secondary | ICD-10-CM | POA: Diagnosis not present

## 2023-04-18 DIAGNOSIS — Z130 Encounter for screening for diseases of the blood and blood-forming organs and certain disorders involving the immune mechanism: Secondary | ICD-10-CM | POA: Diagnosis not present

## 2023-04-27 DIAGNOSIS — Z13820 Encounter for screening for osteoporosis: Secondary | ICD-10-CM | POA: Diagnosis not present

## 2023-07-18 DIAGNOSIS — Z0000 Encounter for general adult medical examination without abnormal findings: Secondary | ICD-10-CM | POA: Diagnosis not present

## 2023-07-18 DIAGNOSIS — F418 Other specified anxiety disorders: Secondary | ICD-10-CM | POA: Diagnosis not present

## 2023-07-18 DIAGNOSIS — E7800 Pure hypercholesterolemia, unspecified: Secondary | ICD-10-CM | POA: Diagnosis not present

## 2023-07-19 ENCOUNTER — Other Ambulatory Visit (HOSPITAL_COMMUNITY): Payer: Self-pay | Admitting: Family Medicine

## 2023-07-19 DIAGNOSIS — E7800 Pure hypercholesterolemia, unspecified: Secondary | ICD-10-CM

## 2023-07-28 IMAGING — MG DIGITAL DIAGNOSTIC BILAT W/ TOMO W/ CAD
6 of 10 series · 6 of 30 positions shown · non-contrast
Comparison: Previous exam(s).

CLINICAL DATA: Patient complains of a palpable abnormality in the
upper-outer quadrant of the right breast.

EXAM:
DIGITAL DIAGNOSTIC BILATERAL MAMMOGRAM WITH TOMOSYNTHESIS AND CAD;
ULTRASOUND RIGHT BREAST LIMITED
TECHNIQUE: Bilateral digital diagnostic mammography and breast tomosynthesis
was performed. The images were evaluated with computer-aided
detection.; Targeted ultrasound examination of the right breast was
performed

[L MLO synth-2D]
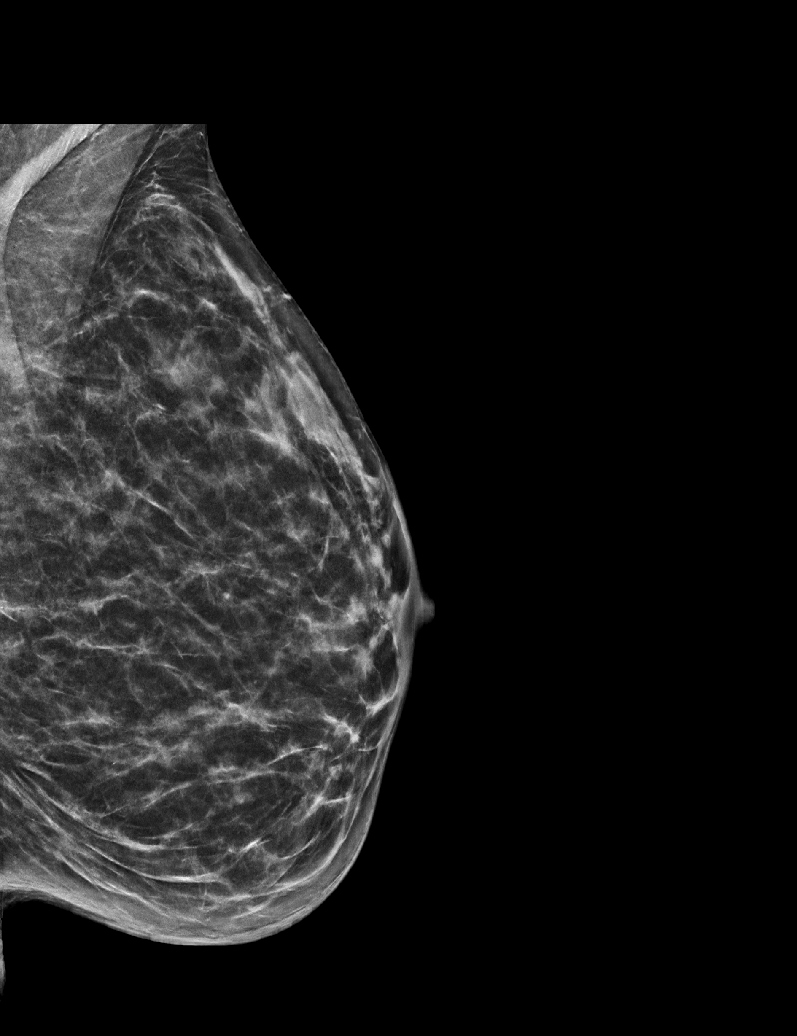

[R MLO synth-2D]
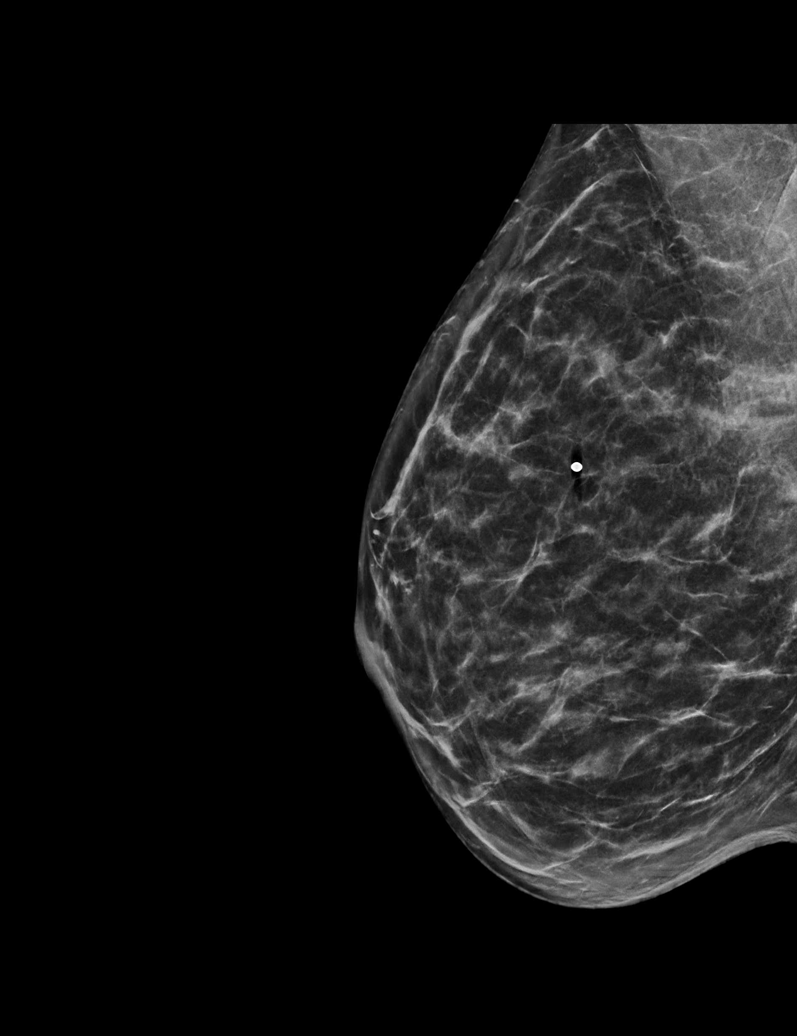

[R CC synth-2D (1 of 2)]
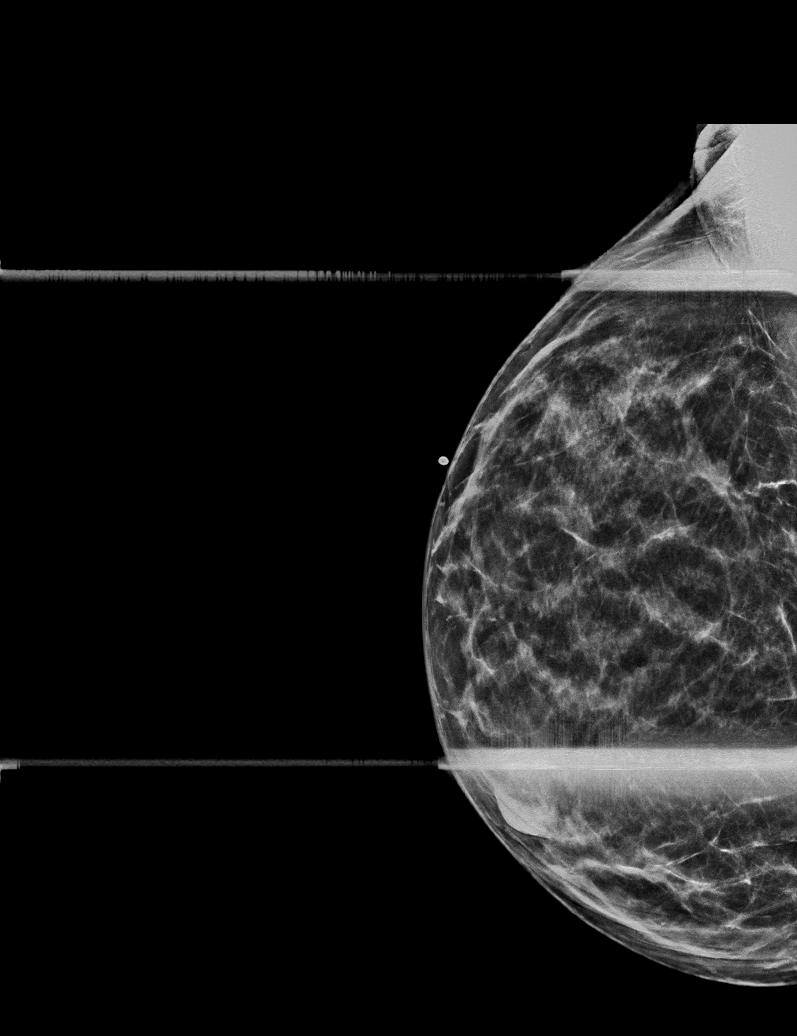

[L CC synth-2D]
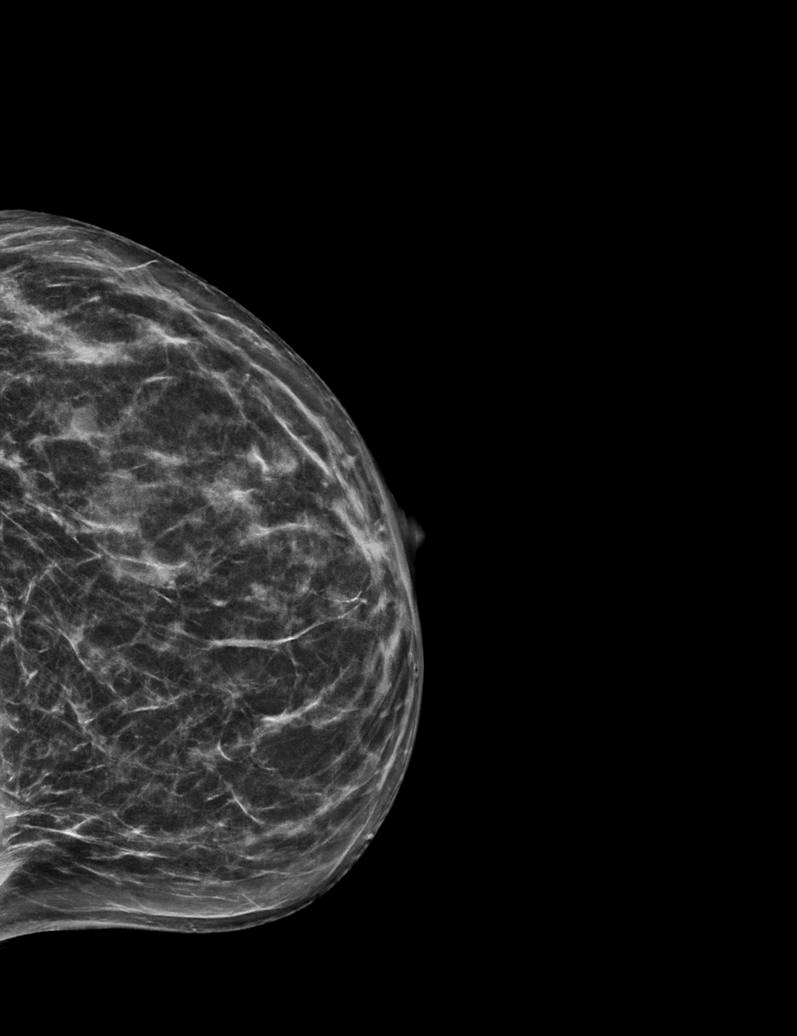

[R CC synth-2D (2 of 2)]
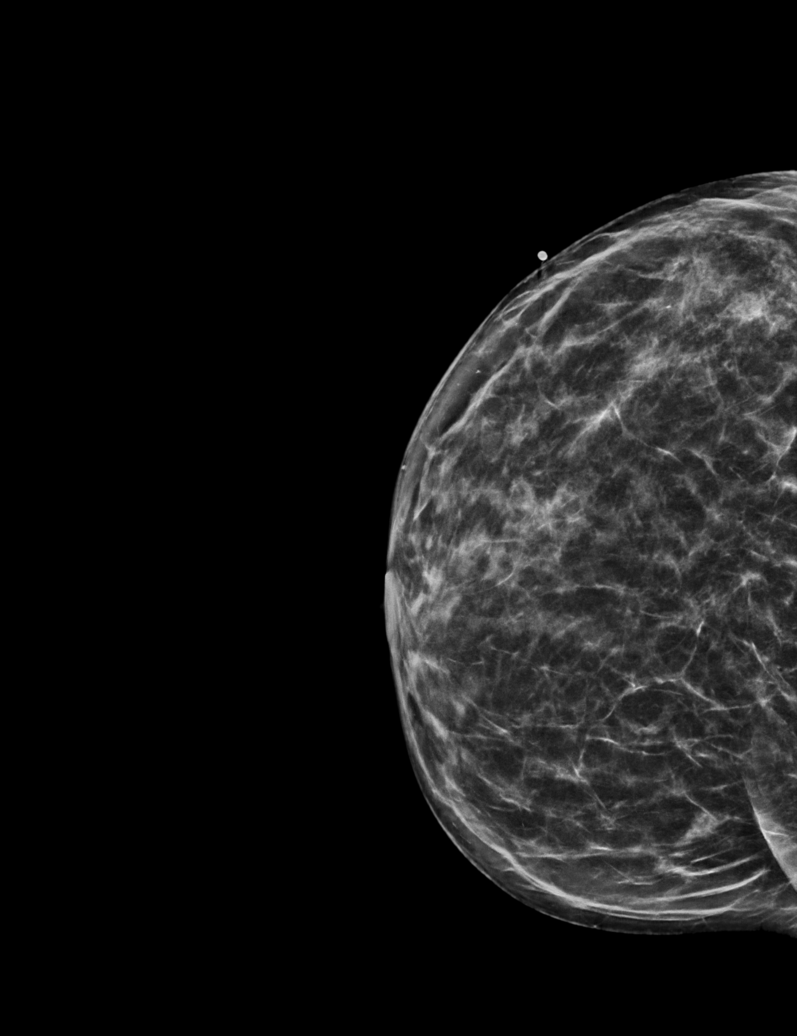

[R CC tomo · tomo slice 31/62.0]
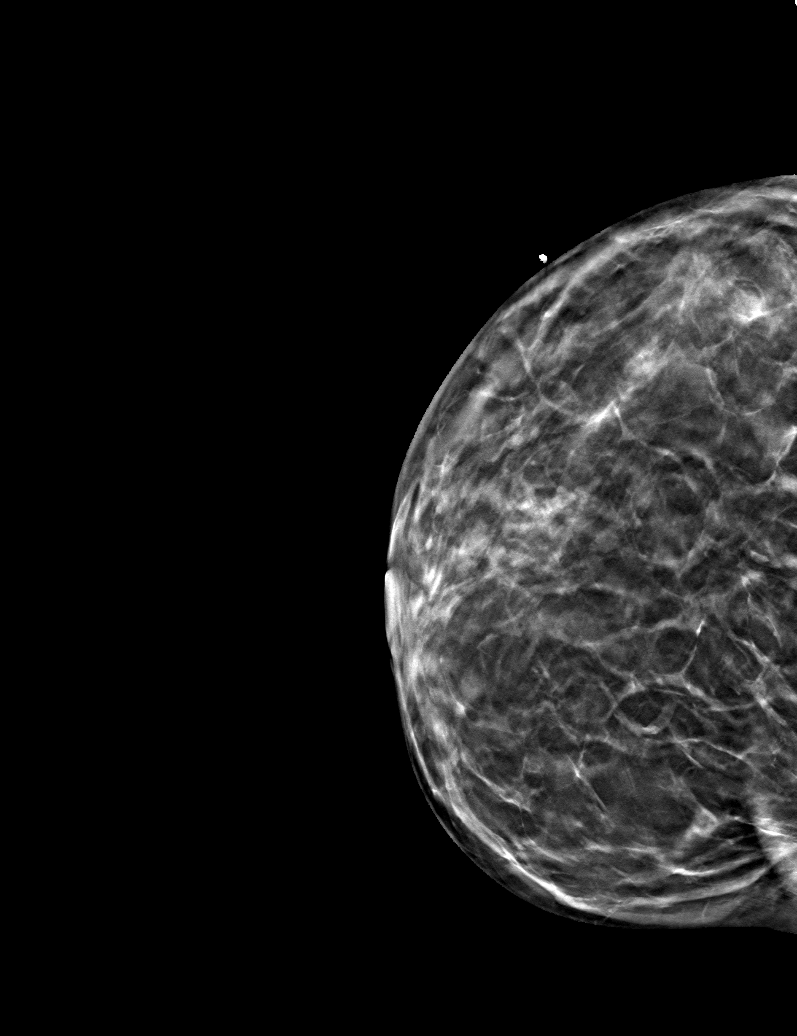

[6 of 30 positions shown; findings below may reference images not displayed]

ACR Breast Density Category c: The breast tissue is heterogeneously
dense, which may obscure small masses.
FINDINGS: No suspicious mass, malignant type microcalcifications or distortion
detected in either breast. Spot tangential view of the area of
clinical concern in the right breast shows normal fibroglandular
tissue.

On physical exam, there is diffuse thickening in the upper-outer
quadrant of the right breast with no discrete palpable mass.

Targeted ultrasound is performed, showing normal tissue in the
upper-outer quadrant of the right breast. No solid or cystic mass,
abnormal shadowing or distortion visualized.
IMPRESSION: No evidence of malignancy in either breast.

RECOMMENDATION:
Bilateral screening mammogram in 1 year is recommended.

The importance of self-breast examination was discussed with the
patient. If the patient feels an enlarging or changing mass in the
right breast she should return for additional imaging evaluation.

I have discussed the findings and recommendations with the patient.
If applicable, a reminder letter will be sent to the patient
regarding the next appointment.

BI-RADS CATEGORY  1: Negative.

## 2023-08-16 ENCOUNTER — Ambulatory Visit (HOSPITAL_COMMUNITY)
Admission: RE | Admit: 2023-08-16 | Discharge: 2023-08-16 | Disposition: A | Discharge status: Home / Self Care | Payer: BC Managed Care – PPO | Source: Ambulatory Visit | Attending: Family Medicine | Admitting: Family Medicine

## 2023-08-16 DIAGNOSIS — E7800 Pure hypercholesterolemia, unspecified: Secondary | ICD-10-CM | POA: Insufficient documentation
